# Patient Record
Sex: Female | Born: 1945 | Race: White | Hispanic: No | Marital: Married | Smoking: Former smoker
Health system: Southern US, Community
[De-identification: ages and names within clinical notes are randomized; demographics above are authoritative.]

## PROBLEM LIST (undated history)

## (undated) DIAGNOSIS — E7800 Pure hypercholesterolemia, unspecified: Secondary | ICD-10-CM

## (undated) DIAGNOSIS — N8110 Cystocele, unspecified: Secondary | ICD-10-CM

## (undated) HISTORY — PX: BREAST BIOPSY: SHX20

## (undated) HISTORY — PX: ANKLE FRACTURE SURGERY: SHX122

---

## 2011-07-28 ENCOUNTER — Inpatient Hospital Stay: Payer: Self-pay | Admitting: Unknown Physician Specialty

## 2011-07-28 LAB — CBC
HCT: 36.1 % (ref 35.0–47.0)
MCH: 30.5 pg (ref 26.0–34.0)
Platelet: 175 10*3/uL (ref 150–440)
WBC: 8.1 10*3/uL (ref 3.6–11.0)

## 2011-07-28 LAB — BASIC METABOLIC PANEL
Anion Gap: 8 (ref 7–16)
Chloride: 109 mmol/L — ABNORMAL HIGH (ref 98–107)
Co2: 26 mmol/L (ref 21–32)
EGFR (Non-African Amer.): >60
Glucose: 114 mg/dL — ABNORMAL HIGH (ref 65–99)
Potassium: 3.8 mmol/L (ref 3.5–5.1)
Sodium: 143 mmol/L (ref 136–145)

## 2014-08-04 NOTE — Discharge Summary (Signed)
PATIENT NAME:  [name], [name]#:  [id number] DATE OF BIRTH:  02-20-1946  DATE OF ADMISSION:  07/28/2011 DATE OF DISCHARGE:  07/30/2011  ADMITTING DIAGNOSIS: Left ankle trimalleolar fracture.   DISCHARGE DIAGNOSIS: Left ankle trimalleolar fracture status post ORIF of medial and lateral malleoli.   ATTENDING: Ruthann CancerJames Califf, MD, [facility]    PROCEDURE: On April 18th, the patient underwent ORIF of left ankle medial and lateral malleoli by Dr. Gerrit Krause.   ANESTHESIA: Spinal.   ESTIMATED BLOOD LOSS: 50 mL.  FLUIDS REPLACED: Crystalloid 1700 mL.  IMPLANTS: DFX long plate with 3.5 and 4.0 screws.   DRAINS: No drains were placed.   COMPLICATIONS: No complications occurred.   PATIENT HISTORY: Ms. [name] is a 69 year old who had slipped while getting out of the shower and [facility]. Ankle fracture was reduced by Dr. Gerrit Krause. X-ray showed trimalleolar fracture/dislocation.  ALLERGIES AND ADVERSE REACTIONS: None known.   PHYSICAL EXAMINATION: CARDIAC: Regular rate. RESPIRATORY: Clear breath sounds. EXTREMITIES: No pain except for left ankle which is in splint. NEUROVASCULAR: Intact.  RADIOLOGY: Chest x-ray had shown no acute abnormality. X-ray of the ankle did reveal fracture/dislocation.   HOSPITAL COURSE: The patient was admitted on April 18th because of the aforementioned fracture. Her ECG was within normal limits. Her hemogram and basic metabolic panel was largely within normal limits. She would be n.p.o. after midnight and undergo ORIF surgery by Dr. Gerrit Krause without complication on the 18th. She was taken to the PAC-U and then the orthopedic floor in stable condition. On the 19th she really was not having much pain. She had had some bloody serous drainage from her bandage splint area which was essentially normal. She would work with physical therapy and was able to ambulate 120 feet. She was nonweightbearing on her fractured leg of course. Her Foley catheter was  removed and she did pass a small amount of stool on the 19th and she would be discharged to home in stable condition the same day.   DISCHARGE MEDICATIONS:  1. Oxycodone 5 to 10 mg every four hours as needed for pain.  2. Celebrex 200 mg twice a day as needed for pain and swelling.  3. Aspirin 325 mg once a day.   DISCHARGE INSTRUCTIONS AND FOLLOW-UP: 1. She is nonweightbearing on her fractured ankle. She is to elevate the ankle.  2. Regular diet with no concentrated sweets or sugar.  3. She is to leave her dressing on. 4. Call our office for any disturbing symptoms.  5. She will follow-up for dressing change in a [facility].   ____________________________ Ashley MoynahanJonathan R. Clyde Krause, [place] jrp:drc D: 07/30/2011 17:36:57 ET T: 08/01/2011 12:39:26 ET JOB#: [id number]  cc: Ashley MoynahanJonathan R. Clyde Krause, [place], <Dictator> Ashley MoynahanJONATHAN R Irais Mottram PA ELECTRONICALLY SIGNED 08/03/2011 8:10

## 2014-08-04 NOTE — H&P (Signed)
Subjective/Chief Complaint Left ankle fracture/dislocatioon    History of Present Illness Slipped getting olut of shower. Brought to ER. Reduced by me. Xray: Trimalleollar fracture   ALLERGIES:  No Known Allergies:   Family and Social History:   Family History Non-Contributory    Social History negative tobacco, negative ETOH, negative Illicit drugs    Place of Living Home   Review of Systems:   Fever/Chills No    Cough No    Sputum No    Abdominal Pain No    Diarrhea No    Constipation No    Nausea/Vomiting No    SOB/DOE No    Chest Pain No    Dysuria No    Tolerating Diet Yes    Medications/Allergies Reviewed Medications/Allergies reviewed   Physical Exam:   GEN well developed    HEENT PERRL    NECK supple    RESP clear BS    CARD regular rate    ABD denies tenderness    EXTR No pain except left ankle. In splint. NV intact    SKIN normal to palpation    NEURO motor/sensory function intact    PSYCH A+O to time, place, person   Routine Hem:  17-Apr-13 18:47    WBC (CBC) 8.1   RBC (CBC) 4.00   Hemoglobin (CBC) 12.2   Hematocrit (CBC) 36.1   Platelet Count (CBC) 175   MCV 90   MCH 30.5   MCHC 33.8   RDW 13.3  Routine Chem:  17-Apr-13 18:47    Glucose, Serum 114   BUN 15   Creatinine (comp) 0.75   Sodium, Serum 143   Potassium, Serum 3.8   Chloride, Serum 109   CO2, Serum 26   Calcium (Total), Serum 8.5   Anion Gap 8   Osmolality (calc) 287   eGFR (African American) >60   eGFR (Non-African American) >60   Radiology Results: XRay:    17-Apr-13 18:26, Ankle Left AP and Lateral   Ankle Left AP and Lateral   REASON FOR EXAM:    eval fx  COMMENTS:       PROCEDURE: DXR - DXR ANKLE LEFT AP AND LATERAL  - Jul 28 2011  6:26PM     RESULT:     A distal fibular diaphysis fracture is present. There is a fracture of   the posterior aspect of the distal tibia. A medial malleolar fracture is   noted. Lateral malleolar fracture is  present. Slight displacement of   fracture is noted. The patient is in a cast.    IMPRESSION:  Complex fractures about the ankle involving the distal   fibular shaft, lateral malleolus,medial malleolus and posterior   malleolus. The patient is status post cast reduction.  Thank you for this opportunity to contribute to the care of your patient.           Verified By: Osa Craver, M.D., MD    17-Apr-13 18:26, Chest 1 View AP or PA   Chest 1 View AP or PA   REASON FOR EXAM:    PRE OPERATIVE  COMMENTS:       PROCEDURE: DXR - DXR CHEST 1 VIEWAP OR PA  - Jul 28 2011  6:26PM     RESULT:     The lungs are clear. The cardiovascular structures are unremarkable.    IMPRESSION:  No acute abnormality.      Thank you for this opportunity to contribute to the care of your patient.  Verified By: Osa Craver, M.D., MD     Assessment/Admission Diagnosis 1. Trimalleollar fx-dislocation left ankle    Plan ORIF medial and lateral. Risks and benefits discussed. All questions answered   Electronic Signatures: Lynnda Shields (MD)  (Signed 18-Apr-13 12:39)  Authored: CHIEF COMPLAINT and HISTORY, ALLERGIES, FAMILY AND SOCIAL HISTORY, REVIEW OF SYSTEMS, PHYSICAL EXAM, LABS, Radiology, ASSESSMENT AND PLAN   Last Updated: 18-Apr-13 12:39 by Lynnda Shields (MD)

## 2014-08-04 NOTE — Op Note (Signed)
PATIENT NAME:  [name], [name] MR#:  [id number] DATE OF BIRTH:  02/23/1946  DATE OF PROCEDURE:  07/29/2011  PREOPERATIVE DIAGNOSIS: Trimalleolar fracture-dislocation, left ankle.   POSTOPERATIVE DIAGNOSIS: Trimalleolar fracture-dislocation, left ankle.   PROCEDURE: Open reduction internal fixation of left ankle, medial and lateral aspects.   SURGEON: Winn JockJames C. Anyla Israelson, MD   ASSISTANT: None.   ANESTHESIA: Spinal.   ESTIMATED BLOOD LOSS: Minimal.   COMPLICATIONS: None.   TOURNIQUET: Not inflated.   IMPLANTS USED: DFX long fibular plate, 3.5 and 4.0 mm screws, two 4.0 mm cannulated screws medially.   COMPLICATIONS: None.   DRAINS: None.   BRIEF CLINICAL NOTE AND PATHOLOGY: The patient suffered the above-mentioned fracture on 04/17. I saw her in the Emergency Room and reduced her fracture and obtained x-rays. There was a small posterior fragment. The fracture had reduced fairly well but was very unstable. There was significant comminution of the fibula. Options, risks, and benefits were discussed with the patient. She was brought to the operating room and the hardware had fairly good fixation. Reduction was essentially anatomic. The posterior fragment reduced nicely and did not need fixation. The mortise stress showed no widening.   PROCEDURE: Preop antibiotics, adequate spinal anesthesia, supine position, routine prepping and draping. Appropriate time-out was called. Routine lateral incision was made posterior to the fibula. Subcutaneous tissues were carefully dissected, fracture was exposed. Two lag screws were placed to reduce the comminution. The plate was then applied in a routine fashion with a combination of cortical screws and locking screws. This was done with fluoroscopic guidance. Fluoroscopic views showed good positioning.   An anteromedial incision was then made exposing the medial fracture. This was reduced after irrigating the hematoma. Two cannulated screws were placed and  checked on multiplanar views. The mortise looked good. Stressing showed no widening. Incisions were thoroughly irrigated, closed with Vicryl and Monocryl. Soft sterile dressing was applied with the ankle in a right angle. Vascular examination was intact after the procedure. The spinal was still in effect. Sponge and needle counts were reported as correct prior to and after wound closure. The patient was awakened and taken to the PAC-U having tolerated the procedure well.    ____________________________ Winn JockJames C. Gerrit Heckaliff, MD jcc:drc D: 07/30/2011 07:46:52 ET T: 07/30/2011 08:23:22 ET JOB#: [id number]  cc: Winn JockJames C. Gerrit Heckaliff, MD, <Dictator> Winn JockJAMES C Jhan Conery MD ELECTRONICALLY SIGNED 08/01/2011 18:46

## 2016-07-01 ENCOUNTER — Other Ambulatory Visit: Payer: Self-pay | Admitting: Internal Medicine

## 2016-07-01 DIAGNOSIS — Z0000 Encounter for general adult medical examination without abnormal findings: Secondary | ICD-10-CM | POA: Diagnosis not present

## 2016-07-01 DIAGNOSIS — Z23 Encounter for immunization: Secondary | ICD-10-CM | POA: Diagnosis not present

## 2016-07-01 DIAGNOSIS — Z1231 Encounter for screening mammogram for malignant neoplasm of breast: Secondary | ICD-10-CM

## 2016-07-01 DIAGNOSIS — N8110 Cystocele, unspecified: Secondary | ICD-10-CM | POA: Diagnosis not present

## 2016-07-05 DIAGNOSIS — Z23 Encounter for immunization: Secondary | ICD-10-CM | POA: Diagnosis not present

## 2016-07-05 DIAGNOSIS — Z0000 Encounter for general adult medical examination without abnormal findings: Secondary | ICD-10-CM | POA: Diagnosis not present

## 2016-08-03 ENCOUNTER — Encounter: Payer: Self-pay | Admitting: Radiology

## 2016-08-03 ENCOUNTER — Ambulatory Visit
Admission: RE | Admit: 2016-08-03 | Discharge: 2016-08-03 | Disposition: A | Discharge status: Home / Self Care | Payer: Medicare HMO | Source: Ambulatory Visit | Attending: Internal Medicine | Admitting: Internal Medicine

## 2016-08-03 DIAGNOSIS — Z1231 Encounter for screening mammogram for malignant neoplasm of breast: Secondary | ICD-10-CM | POA: Diagnosis not present

## 2016-08-09 ENCOUNTER — Encounter: Payer: Self-pay | Admitting: Urology

## 2016-08-09 ENCOUNTER — Ambulatory Visit (INDEPENDENT_AMBULATORY_CARE_PROVIDER_SITE_OTHER): Payer: Medicare HMO | Admitting: Urology

## 2016-08-09 VITALS — BP 123/71 | HR 60 | Ht 62.0 in | Wt 170.9 lb

## 2016-08-09 DIAGNOSIS — N8110 Cystocele, unspecified: Secondary | ICD-10-CM

## 2016-08-09 LAB — URINALYSIS, COMPLETE
BILIRUBIN UA: NEGATIVE
GLUCOSE, UA: NEGATIVE
KETONES UA: NEGATIVE
NITRITE UA: NEGATIVE
PROTEIN UA: NEGATIVE
SPEC GRAV UA: 1.025 (ref 1.005–1.030)
Urobilinogen, Ur: 0.2 mg/dL (ref 0.2–1.0)
pH, UA: 5 (ref 5.0–7.5)

## 2016-08-09 LAB — MICROSCOPIC EXAMINATION: RBC, UA: NONE SEEN /hpf (ref 0–?)

## 2016-08-09 LAB — BLADDER SCAN AMB NON-IMAGING: SCAN RESULT: 21

## 2016-08-09 NOTE — Progress Notes (Signed)
08/09/2016 10:42 AM   [name] Jan 07, 1946 [id number]  Referring provider: Lauro Regulus, MD [address] [facility] [place], [place] [address]  Chief Complaint  Patient presents with  . New Patient (Initial Visit)    cystocele    HPI: Consult for weak stream. Pt with urinary frequency, hesitancy, weak stream and intermittent flow. She was having some SP discomfort. That started 1-2 mo ago, but fortunately her symptoms have improved. She's not bothered by any symptoms today. No NG risk. 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 She does feel a bulge and can reduce it. She's not bothered and can "put up with it". No pelvic surgery. 2 kids - both NSVD.   UA clear. PVR 21 ml.     PMH: History reviewed. No pertinent past medical history.  Surgical History: Past Surgical History:  Procedure Laterality Date  . ANKLE FRACTURE SURGERY Left   . BREAST BIOPSY Right 10+ yrs ago   NEG    Home Medications:  Allergies as of 08/09/2016   No Known Allergies     Medication List       Accurate as of 08/09/16 10:42 AM. Always use your most recent med list.          BC HEADACHE POWDER PO Take by mouth.   multivitamin tablet Take 1 tablet by mouth daily.       Allergies: No Known Allergies  Family History: Family History  Problem Relation Age of Onset  . Breast cancer Neg Hx   . Bladder Cancer Neg Hx   . Kidney cancer Neg Hx     Social History:  reports that she has quit smoking. She has never used smokeless tobacco. She reports that she does not drink alcohol or use drugs.  ROS: UROLOGY Frequent Urination?: No Hard to postpone urination?: No Burning/pain with urination?: No Get up at night to urinate?: No Leakage of urine?: No Urine stream starts and stops?: Yes Trouble starting stream?: Yes Do you have to strain to urinate?: No Blood in urine?: No Urinary tract infection?: No Sexually transmitted disease?:  No Injury to kidneys or bladder?: No Painful intercourse?: No Weak stream?: Yes Currently pregnant?: No Vaginal bleeding?: No Last menstrual period?: n  Gastrointestinal Nausea?: No Vomiting?: No Indigestion/heartburn?: No Diarrhea?: No Constipation?: No  Constitutional Fever: No Night sweats?: No Weight loss?: No Fatigue?: No  Skin Skin rash/lesions?: No Itching?: No  Eyes Blurred vision?: No Double vision?: No  Ears/Nose/Throat Sore throat?: No Sinus problems?: No  Hematologic/Lymphatic Swollen glands?: No Easy bruising?: No  Cardiovascular Leg swelling?: No Chest pain?: No  Respiratory Cough?: No Shortness of breath?: No  Endocrine Excessive thirst?: No  Musculoskeletal Back pain?: No Joint pain?: No  Neurological Headaches?: Yes Dizziness?: No  Psychologic Depression?: No Anxiety?: No  Physical Exam: Chaperone - Carrie BP 123/71 (BP Location: Left Arm, Patient Position: Sitting, Cuff Size: Normal)   Pulse 60   Ht  (1.575 m)   Wt 77.5 kg (170 lb 14.4 oz)   BMI 31.26 kg/m   Constitutional:  Alert and oriented, No acute distress. HEENT: [place] AT, moist mucus membranes.  Trachea midline, no masses. Cardiovascular: No clubbing, cyanosis, or edema. Respiratory: Normal respiratory effort, no increased work of breathing. GI: Abdomen is soft, nontender, nondistended, no abdominal masses GU: No CVA tenderness. Mild atrophy, slight enlarged introitus, no masses. Meatus nl. Bladder and urethra palpably normal. Grade II-III cystocele with apical descent  easily reducible. Skin: No rashes, bruises or suspicious lesions. Lymph: No cervical or inguinal adenopathy. Neurologic: Grossly intact, no focal deficits, moving all 4 extremities. Psychiatric: Normal mood and affect.  Laboratory Data: Lab Results  Component Value Date   WBC 8.1 07/28/2011   HGB 12.2 07/28/2011   HCT 36.1 07/28/2011   MCV 90 07/28/2011   PLT 175 07/28/2011    Lab  Results  Component Value Date   CREATININE 0.75 07/28/2011    No results found for: PSA  No results found for: TESTOSTERONE  No results found for: HGBA1C  Urinalysis No results found for: COLORURINE, APPEARANCEUR, LABSPEC, PHURINE, GLUCOSEU, HGBUR, BILIRUBINUR, KETONESUR, PROTEINUR, UROBILINOGEN, NITRITE, LEUKOCYTESUR   Assessment & Plan:   1. Cystocele, unspecified (CODE) - discussed with patient nature of POP and cystocele. Her lower urinary tract symptoms resolved. Discussed nature r/b of surveillance, pessary and surgical repair +/- hysterectomy. All questions answered. Again, she's not bothered and wants to continue surveillance.  We'll see her anytime PRN.   - Urinalysis, Complete - Bladder Scan (Post Void Residual) in office   No Follow-up on file.  Jerilee Field, MD  [facility] [address], [address] [place], [place] [address] [phone]

## 2016-08-10 ENCOUNTER — Other Ambulatory Visit: Payer: Self-pay | Admitting: Internal Medicine

## 2016-08-10 DIAGNOSIS — N6489 Other specified disorders of breast: Secondary | ICD-10-CM

## 2016-08-10 DIAGNOSIS — R921 Mammographic calcification found on diagnostic imaging of breast: Secondary | ICD-10-CM

## 2016-08-10 DIAGNOSIS — R928 Other abnormal and inconclusive findings on diagnostic imaging of breast: Secondary | ICD-10-CM

## 2016-08-16 DIAGNOSIS — Z0000 Encounter for general adult medical examination without abnormal findings: Secondary | ICD-10-CM | POA: Diagnosis not present

## 2016-08-16 DIAGNOSIS — Z23 Encounter for immunization: Secondary | ICD-10-CM | POA: Diagnosis not present

## 2016-08-17 ENCOUNTER — Ambulatory Visit
Admission: RE | Admit: 2016-08-17 | Discharge: 2016-08-17 | Disposition: A | Discharge status: Home / Self Care | Payer: Medicare HMO | Source: Ambulatory Visit | Attending: Internal Medicine | Admitting: Internal Medicine

## 2016-08-17 DIAGNOSIS — R921 Mammographic calcification found on diagnostic imaging of breast: Secondary | ICD-10-CM

## 2016-08-17 DIAGNOSIS — R922 Inconclusive mammogram: Secondary | ICD-10-CM | POA: Diagnosis not present

## 2016-08-17 DIAGNOSIS — R928 Other abnormal and inconclusive findings on diagnostic imaging of breast: Secondary | ICD-10-CM

## 2016-08-17 DIAGNOSIS — N6489 Other specified disorders of breast: Secondary | ICD-10-CM

## 2016-08-19 ENCOUNTER — Other Ambulatory Visit: Payer: Self-pay | Admitting: Internal Medicine

## 2016-08-19 DIAGNOSIS — N6489 Other specified disorders of breast: Secondary | ICD-10-CM

## 2016-08-19 DIAGNOSIS — I700 Atherosclerosis of aorta: Secondary | ICD-10-CM

## 2017-06-24 DIAGNOSIS — Z0000 Encounter for general adult medical examination without abnormal findings: Secondary | ICD-10-CM | POA: Diagnosis not present

## 2017-06-24 DIAGNOSIS — Z1231 Encounter for screening mammogram for malignant neoplasm of breast: Secondary | ICD-10-CM | POA: Diagnosis not present

## 2017-06-24 DIAGNOSIS — G44219 Episodic tension-type headache, not intractable: Secondary | ICD-10-CM | POA: Diagnosis not present

## 2017-06-24 DIAGNOSIS — M1712 Unilateral primary osteoarthritis, left knee: Secondary | ICD-10-CM | POA: Diagnosis not present

## 2017-06-24 DIAGNOSIS — M170 Bilateral primary osteoarthritis of knee: Secondary | ICD-10-CM | POA: Diagnosis not present

## 2017-07-18 DIAGNOSIS — Z0000 Encounter for general adult medical examination without abnormal findings: Secondary | ICD-10-CM | POA: Diagnosis not present

## 2019-04-04 ENCOUNTER — Ambulatory Visit: Payer: Medicare Other | Attending: Internal Medicine

## 2019-04-04 DIAGNOSIS — Z20822 Contact with and (suspected) exposure to covid-19: Secondary | ICD-10-CM

## 2019-04-06 LAB — NOVEL CORONAVIRUS, NAA: SARS-CoV-2, NAA: NOT DETECTED

## 2019-04-07 ENCOUNTER — Telehealth: Payer: Self-pay | Admitting: Internal Medicine

## 2019-04-07 NOTE — Telephone Encounter (Signed)
Negative COVID results given. Patient results "NOT Detected." Caller expressed understanding. ° °

## 2021-05-12 ENCOUNTER — Other Ambulatory Visit
Admission: RE | Admit: 2021-05-12 | Discharge: 2021-05-12 | Disposition: A | Discharge status: Home / Self Care | Payer: Medicare Other | Source: Ambulatory Visit | Attending: Internal Medicine | Admitting: Internal Medicine

## 2021-05-12 DIAGNOSIS — R0789 Other chest pain: Secondary | ICD-10-CM | POA: Insufficient documentation

## 2021-05-12 LAB — D-DIMER, QUANTITATIVE: D-Dimer, Quant: 0.5 ug/mL-FEU (ref 0.00–0.50)

## 2021-05-12 LAB — TROPONIN I (HIGH SENSITIVITY): Troponin I (High Sensitivity): 5 ng/L (ref <18)

## 2021-07-07 ENCOUNTER — Other Ambulatory Visit: Payer: Self-pay | Admitting: Cardiology

## 2021-07-07 DIAGNOSIS — E7800 Pure hypercholesterolemia, unspecified: Secondary | ICD-10-CM

## 2021-07-07 DIAGNOSIS — R079 Chest pain, unspecified: Secondary | ICD-10-CM

## 2021-07-10 ENCOUNTER — Telehealth (HOSPITAL_COMMUNITY): Payer: Self-pay | Admitting: *Deleted

## 2021-07-10 NOTE — Telephone Encounter (Signed)
Reaching out to patient to offer assistance regarding upcoming cardiac imaging study; pt verbalizes understanding of appt date/time, parking situation and where to check in, pre-test NPO status  and verified current allergies; name and call back number provided for further questions should they arise ? ?Ashley Graffam RN Navigator Cardiac Imaging ?[facility] ?[phone] office ?[phone] cell ? ?

## 2021-07-13 ENCOUNTER — Ambulatory Visit
Admission: RE | Admit: 2021-07-13 | Discharge: 2021-07-13 | Disposition: A | Discharge status: Home / Self Care | Payer: Medicare Other | Source: Ambulatory Visit | Attending: Cardiology | Admitting: Cardiology

## 2021-07-13 ENCOUNTER — Other Ambulatory Visit: Payer: Self-pay

## 2021-07-13 DIAGNOSIS — R9431 Abnormal electrocardiogram [ECG] [EKG]: Secondary | ICD-10-CM | POA: Diagnosis not present

## 2021-07-13 DIAGNOSIS — R079 Chest pain, unspecified: Secondary | ICD-10-CM | POA: Insufficient documentation

## 2021-07-13 DIAGNOSIS — R0789 Other chest pain: Secondary | ICD-10-CM | POA: Insufficient documentation

## 2021-07-13 DIAGNOSIS — E7800 Pure hypercholesterolemia, unspecified: Secondary | ICD-10-CM | POA: Insufficient documentation

## 2021-07-13 DIAGNOSIS — R9439 Abnormal result of other cardiovascular function study: Secondary | ICD-10-CM | POA: Insufficient documentation

## 2021-07-13 DIAGNOSIS — Z79899 Other long term (current) drug therapy: Secondary | ICD-10-CM | POA: Insufficient documentation

## 2021-07-13 DIAGNOSIS — Z9181 History of falling: Secondary | ICD-10-CM | POA: Diagnosis not present

## 2021-07-13 LAB — POCT I-STAT CREATININE: Creatinine, Ser: 1.1 mg/dL — ABNORMAL HIGH (ref 0.44–1.00)

## 2021-07-13 MED ORDER — NITROGLYCERIN 0.4 MG SL SUBL
0.8000 mg | SUBLINGUAL_TABLET | Freq: Once | SUBLINGUAL | Status: AC
  Administered 2021-07-13: 0.8 mg via SUBLINGUAL

## 2021-07-13 MED ORDER — IOHEXOL 350 MG/ML SOLN
80.0000 mL | Freq: Once | INTRAVENOUS | Status: AC | PRN
  Administered 2021-07-13: 75 mL via INTRAVENOUS

## 2021-07-13 NOTE — Progress Notes (Signed)
Patient tolerated procedure well. Ambulate w/o difficulty. Denies any lightheadedness or being dizzy. Pt denies any pain at this time. Sitting in chair, drinking water provided. P is encouraged to drink additional water throughout the day and reason explained to patient. Patient verbalized understanding and all questions answered. ABC intact. No further needs at this time. Discharge from procedure area w/o issues.  °

## 2022-07-23 ENCOUNTER — Emergency Department: Payer: 59

## 2022-07-23 ENCOUNTER — Emergency Department
Admission: EM | Admit: 2022-07-23 | Discharge: 2022-07-23 | Disposition: A | Discharge status: Home / Self Care | Payer: 59 | Attending: Emergency Medicine | Admitting: Emergency Medicine

## 2022-07-23 ENCOUNTER — Other Ambulatory Visit: Payer: Self-pay

## 2022-07-23 ENCOUNTER — Encounter: Payer: Self-pay | Admitting: Intensive Care

## 2022-07-23 DIAGNOSIS — M79605 Pain in left leg: Secondary | ICD-10-CM

## 2022-07-23 DIAGNOSIS — M25562 Pain in left knee: Secondary | ICD-10-CM | POA: Insufficient documentation

## 2022-07-23 DIAGNOSIS — M79652 Pain in left thigh: Secondary | ICD-10-CM | POA: Insufficient documentation

## 2022-07-23 HISTORY — DX: Pure hypercholesterolemia, unspecified: E78.00

## 2022-07-23 LAB — COMPREHENSIVE METABOLIC PANEL
ALT: 23 U/L (ref 0–44)
AST: 34 U/L (ref 15–41)
Albumin: 4.3 g/dL (ref 3.5–5.0)
Alkaline Phosphatase: 55 U/L (ref 38–126)
Anion gap: 10 (ref 5–15)
BUN: 16 mg/dL (ref 8–23)
CO2: 24 mmol/L (ref 22–32)
Calcium: 9.1 mg/dL (ref 8.9–10.3)
Chloride: 106 mmol/L (ref 98–111)
Creatinine, Ser: 0.9 mg/dL (ref 0.44–1.00)
GFR, Estimated: >60 mL/min (ref >60)
Glucose, Bld: 109 mg/dL — ABNORMAL HIGH (ref 70–99)
Potassium: 4 mmol/L (ref 3.5–5.1)
Sodium: 140 mmol/L (ref 135–145)
Total Bilirubin: 0.6 mg/dL (ref 0.3–1.2)
Total Protein: 7.3 g/dL (ref 6.5–8.1)

## 2022-07-23 LAB — CBC WITH DIFFERENTIAL/PLATELET
Abs Immature Granulocytes: 0.02 10*3/uL (ref 0.00–0.07)
Basophils Absolute: 0.1 10*3/uL (ref 0.0–0.1)
Basophils Relative: 1 %
Eosinophils Absolute: 0.1 10*3/uL (ref 0.0–0.5)
Eosinophils Relative: 1 %
HCT: 35 % — ABNORMAL LOW (ref 36.0–46.0)
Hemoglobin: 11.4 g/dL — ABNORMAL LOW (ref 12.0–15.0)
Immature Granulocytes: 0 %
Lymphocytes Relative: 20 %
Lymphs Abs: 1.6 10*3/uL (ref 0.7–4.0)
MCH: 30.3 pg (ref 26.0–34.0)
MCHC: 32.6 g/dL (ref 30.0–36.0)
MCV: 93.1 fL (ref 80.0–100.0)
Monocytes Absolute: 0.7 10*3/uL (ref 0.1–1.0)
Monocytes Relative: 8 %
Neutro Abs: 5.8 10*3/uL (ref 1.7–7.7)
Neutrophils Relative %: 70 %
Platelets: 145 10*3/uL — ABNORMAL LOW (ref 150–400)
RBC: 3.76 MIL/uL — ABNORMAL LOW (ref 3.87–5.11)
RDW: 12.5 % (ref 11.5–15.5)
WBC: 8.3 10*3/uL (ref 4.0–10.5)
nRBC: 0 % (ref 0.0–0.2)

## 2022-07-23 MED ORDER — TRAMADOL HCL 50 MG PO TABS: 50.0000 mg | ORAL_TABLET | Freq: Two times a day (BID) | ORAL | 0 refills | Status: AC | PRN

## 2022-07-23 MED ORDER — PREDNISONE 20 MG PO TABS: 40.0000 mg | ORAL_TABLET | Freq: Every day | ORAL | 0 refills | Status: AC

## 2022-07-23 NOTE — ED Triage Notes (Signed)
Patient c/o left leg pain. Warm to touch and painful. Reports it started yesterday morning and has progressively gotten worse.

## 2022-07-23 NOTE — Discharge Instructions (Signed)
Your leg pain is likely due to sciatica (nerve pain) or a muscle strain or spasm.  Take over-the-counter Tylenol or ibuprofen.  You may take the tramadol if needed for more severe pain that is not relieved by the over-the-counter medications, and you should take the prednisone daily for the next 5 days.  This will help decrease inflammation and should help with the pain.  Return to the ER for new, worsening, or persistent severe pain, swelling or redness to the leg, inability to put weight on the leg, weakness or numbness, or any other new or worsening symptoms that concern you.

## 2022-07-23 NOTE — ED Provider Notes (Signed)
[facility] Provider Note    Event Date/Time   First MD Initiated Contact with Patient 07/23/22 1029     (approximate)   History   Leg Pain   HPI  [name] is a 77 y.o. female with a history of prediabetes, hypercholesterolemia, and osteoarthritis who presents with atraumatic left leg pain for the last several days.  The patient states that it is worse with certain movements and positions and she had difficulty getting out of bed today.  She denies any fall or injury to the leg.  She denies any weakness or numbness and has no tingling.  She states the pain is mainly in the back of the leg especially behind the knee and behind her thigh.  She states it radiates from the hip area.  She has no lower back pain.  She denies any urinary symptoms.  I reviewed the medical records.  The patient was most recently seen by internal medicine on 1/22 for routine follow-up.  She has no recent ED visits or admissions.   Physical Exam   Triage Vital Signs: ED Triage Vitals  Enc Vitals Group     BP 07/23/22 0831 131/70     Pulse Rate 07/23/22 0831 (!) 59     Resp 07/23/22 0831 16     Temp 07/23/22 0836 98.2 F (36.8 C)     Temp Source 07/23/22 0836 Oral     SpO2 07/23/22 0831 100 %     Weight 07/23/22 0832 145 lb (65.8 kg)     Height 07/23/22 0832  (1.575 m)     Head Circumference --      Peak Flow --      Pain Score 07/23/22 0831 10     Pain Loc --      Pain Edu? --      Excl. in GC? --     Most recent vital signs: Vitals:   07/23/22 1100 07/23/22 1200  BP:    Pulse: (!) 53 74  Resp:    Temp:    SpO2: 98% 100%     General: Alert, well-appearing, no distress.  CV:  Good peripheral perfusion.  Resp:  Normal effort.  Abd:  No distention.  Other:  Left thigh with mild tenderness laterally.  No swelling, erythema, induration, or abnormal warmth.  5/5 motor strength and intact sensation proximally and distally.  Pain on ROM of the knee and ROM  limited to approximately 100 degrees in flexion.  2+ DP pulse.  Normal cap refill.  No midline spinal tenderness.   ED Results / Procedures / Treatments   Labs (all labs ordered are listed, but only abnormal results are displayed) Labs Reviewed  CBC WITH DIFFERENTIAL/PLATELET - Abnormal; Notable for the following components:      Result Value   RBC 3.76 (*)    Hemoglobin 11.4 (*)    HCT 35.0 (*)    Platelets 145 (*)    All other components within normal limits  COMPREHENSIVE METABOLIC PANEL - Abnormal; Notable for the following components:   Glucose, Bld 109 (*)    All other components within normal limits     EKG     RADIOLOGY  XR L femur: I independently viewed and interpreted the images; no acute fracture or other abnormality  US venous LLE: No evidence of acute DVT   PROCEDURES:  Critical Care performed: No  Procedures   MEDICATIONS ORDERED IN ED: Medications - No data to display  IMPRESSION / MDM / ASSESSMENT AND PLAN / ED COURSE  I reviewed the triage vital signs and the nursing notes.  77 year old female presents with atraumatic left leg pain rating down from her lower back/hip area along the back and side of the thigh.  There are no significant findings on exam other than some mild tenderness as well as decreased range of motion at the knee.  Basic labs were obtained from triage and show normal electrolytes and no leukocytosis.  Ultrasound is negative for DVT.  Differential diagnosis includes, but is not limited to, sciatica, other neuropathic pain, muscle strain or spasm.  There is no evidence of cellulitis or other acute infectious process.  We will obtain an x-ray for further evaluation.  Patient's presentation is most consistent with acute complicated illness / injury requiring diagnostic workup.  The patient is on the cardiac monitor to evaluate for evidence of arrhythmia and/or significant heart rate  changes.  ----------------------------------------- 12:10 PM on 07/23/2022 -----------------------------------------  X-ray shows no acute findings.  At this time the patient is stable for discharge home.  I counseled her on the results of the workup and plan of care.  Overall I suspect either sciatica or muscle strain/spasm.  I prescribed prednisone as well as tramadol for breakthrough pain.  I gave strict return precautions and the patient expressed understanding.  FINAL CLINICAL IMPRESSION(S) / ED DIAGNOSES   Final diagnoses:  Left leg pain     Rx / DC Orders   ED Discharge Orders          Ordered    traMADol (ULTRAM) 50 MG tablet  Every 12 hours PRN        07/23/22 1209    predniSONE (DELTASONE) 20 MG tablet  Daily        07/23/22 1209             Note:  This document was prepared using Dragon voice recognition software and may include unintentional dictation errors.    Dionne Bucy, MD 07/23/22 1210

## 2022-07-23 NOTE — ED Notes (Signed)
ED Provider at bedside. 

## 2023-06-24 IMAGING — CT CT HEART MORP W/ CTA COR W/ SCORE W/ CA W/CM &/OR W/O CM
1 of 14 series · 3 of 20 positions shown, 4 images · non-contrast
Comparison: None.

Addendum:
CLINICAL DATA: Chest pain

EXAM:
Cardiac/Coronary  CTA
TECHNIQUE: The patient was scanned on a Siemens [employer] go.Top scanner.

[Series 4: multiphase % cta coronary 0.60 · axial · 0.31mm/px · z∈[-1085,-1015]mm · 3 of 3872 slices shown, 4 images]
[im [id number]  vessel]
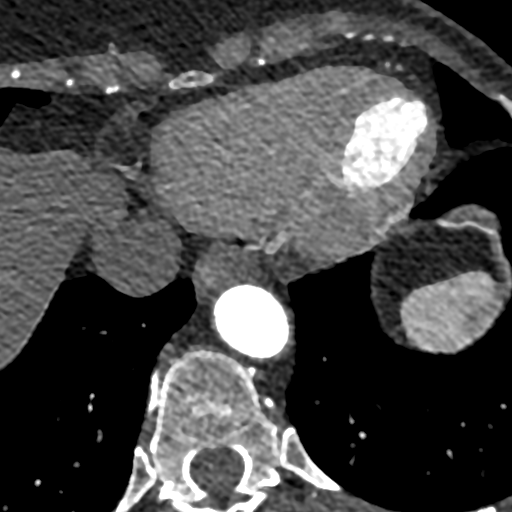
[im [id number]  lung]
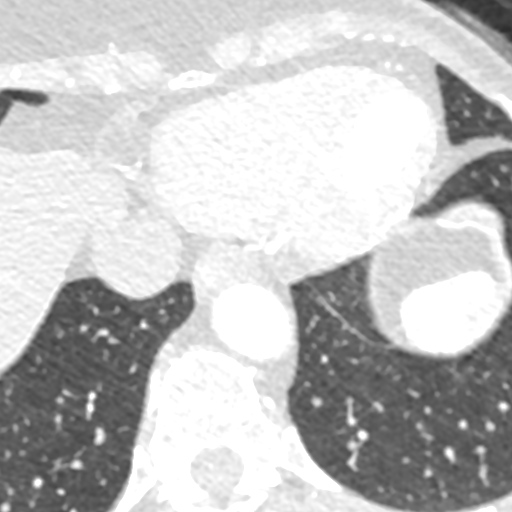
[im [id number]  vessel]
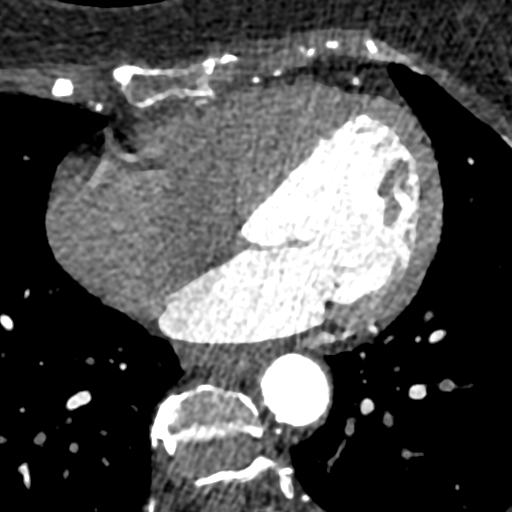
[im [id number]  vessel]
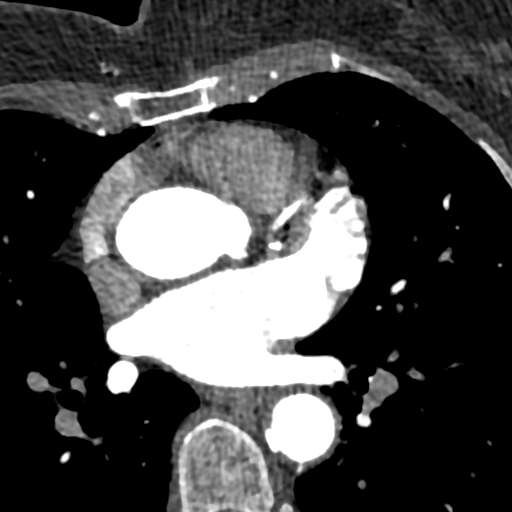

[3 of 20 positions shown; findings below may reference images not displayed]

:
A retrospective scan was triggered in the descending thoracic aorta.
Axial non-contrast 3 mm slices were carried out through the heart.
The data set was analyzed on a dedicated work station and scored
using the Agatson method. Gantry rotation speed was 330 msecs and
collimation was .6 mm. 100mg of metoprolol and 0.8 mg of sl NTG was
given. The 3D data set was reconstructed in 5% intervals of the
60-95 % of the R-R cycle. Diastolic phases were analyzed on a
dedicated work station using MPR, MIP and VRT modes. The patient
received 80 cc of contrast.
FINDINGS: Aorta: Normal size. Descending aorta calcifications. No dissection.

Aortic Valve:  Trileaflet.  No calcifications.

Coronary Arteries:  Normal coronary origin.  Right dominance.

RCA is a dominant artery that gives rise to PDA and PLA. There is no
plaque.

Left main is a large artery that gives rise to LAD and LCX arteries.
LM has no disease.

LAD has no plaque.

LCX is a non-dominant artery that gives rise to two obtuse marginal
branches. There is no plaque.

Other findings:

Normal pulmonary vein drainage into the left atrium.

Normal left atrial appendage without a thrombus.

Normal size of the pulmonary artery.
IMPRESSION: 1. Coronary calcium score of 0.4. This was 18th percentile for age
and gender matched controls.

2. Normal coronary origin with right dominance.

3. No angiographic evidence of CAD.

4. Consider non-atherosclerotic causes of chest pain.

EXAM:
OVER-READ INTERPRETATION  CT CHEST

The following report is an over-read performed by radiologist Dr.
over-read does not include interpretation of cardiac or coronary
anatomy or pathology. The interpretation by the cardiologist is
attached.
FINDINGS: No acute extracardiac findings.
IMPRESSION: No acute extracardiac findings.

*** End of Addendum ***
:
A retrospective scan was triggered in the descending thoracic aorta.
Axial non-contrast 3 mm slices were carried out through the heart.
The data set was analyzed on a dedicated work station and scored
using the Agatson method. Gantry rotation speed was 330 msecs and
collimation was .6 mm. 100mg of metoprolol and 0.8 mg of sl NTG was
given. The 3D data set was reconstructed in 5% intervals of the
60-95 % of the R-R cycle. Diastolic phases were analyzed on a
dedicated work station using MPR, MIP and VRT modes. The patient
received 80 cc of contrast.
FINDINGS: Aorta: Normal size. Descending aorta calcifications. No dissection.

Aortic Valve:  Trileaflet.  No calcifications.

Coronary Arteries:  Normal coronary origin.  Right dominance.

RCA is a dominant artery that gives rise to PDA and PLA. There is no
plaque.

Left main is a large artery that gives rise to LAD and LCX arteries.
LM has no disease.

LAD has no plaque.

LCX is a non-dominant artery that gives rise to two obtuse marginal
branches. There is no plaque.

Other findings:

Normal pulmonary vein drainage into the left atrium.

Normal left atrial appendage without a thrombus.

Normal size of the pulmonary artery.
IMPRESSION: 1. Coronary calcium score of 0.4. This was 18th percentile for age
and gender matched controls.

2. Normal coronary origin with right dominance.

3. No angiographic evidence of CAD.

4. Consider non-atherosclerotic causes of chest pain.

## 2023-12-03 ENCOUNTER — Emergency Department
Admission: EM | Admit: 2023-12-03 | Discharge: 2023-12-03 | Disposition: A | Discharge status: Home / Self Care | Attending: Emergency Medicine | Admitting: Emergency Medicine

## 2023-12-03 ENCOUNTER — Emergency Department

## 2023-12-03 ENCOUNTER — Other Ambulatory Visit: Payer: Self-pay

## 2023-12-03 DIAGNOSIS — K529 Noninfective gastroenteritis and colitis, unspecified: Secondary | ICD-10-CM | POA: Insufficient documentation

## 2023-12-03 DIAGNOSIS — R109 Unspecified abdominal pain: Secondary | ICD-10-CM | POA: Diagnosis present

## 2023-12-03 HISTORY — DX: Cystocele, unspecified: N81.10

## 2023-12-03 LAB — URINALYSIS, ROUTINE W REFLEX MICROSCOPIC
Bilirubin Urine: NEGATIVE
Glucose, UA: NEGATIVE mg/dL
Hgb urine dipstick: NEGATIVE
Ketones, ur: NEGATIVE mg/dL
Nitrite: POSITIVE — AB
Protein, ur: NEGATIVE mg/dL
Specific Gravity, Urine: >1.046 — ABNORMAL HIGH (ref 1.005–1.030)
pH: 5 (ref 5.0–8.0)

## 2023-12-03 LAB — COMPREHENSIVE METABOLIC PANEL WITH GFR
ALT: 23 U/L (ref 0–44)
AST: 39 U/L (ref 15–41)
Albumin: 4.1 g/dL (ref 3.5–5.0)
Alkaline Phosphatase: 48 U/L (ref 38–126)
Anion gap: 10 (ref 5–15)
BUN: 20 mg/dL (ref 8–23)
CO2: 25 mmol/L (ref 22–32)
Calcium: 9.3 mg/dL (ref 8.9–10.3)
Chloride: 105 mmol/L (ref 98–111)
Creatinine, Ser: 1.03 mg/dL — ABNORMAL HIGH (ref 0.44–1.00)
GFR, Estimated: 55 mL/min — ABNORMAL LOW (ref >60)
Glucose, Bld: 136 mg/dL — ABNORMAL HIGH (ref 70–99)
Potassium: 4 mmol/L (ref 3.5–5.1)
Sodium: 140 mmol/L (ref 135–145)
Total Bilirubin: 0.6 mg/dL (ref 0.0–1.2)
Total Protein: 6.9 g/dL (ref 6.5–8.1)

## 2023-12-03 LAB — CBC
HCT: 36.2 % (ref 36.0–46.0)
Hemoglobin: 12.1 g/dL (ref 12.0–15.0)
MCH: 30.9 pg (ref 26.0–34.0)
MCHC: 33.4 g/dL (ref 30.0–36.0)
MCV: 92.3 fL (ref 80.0–100.0)
Platelets: 172 K/uL (ref 150–400)
RBC: 3.92 MIL/uL (ref 3.87–5.11)
RDW: 12.6 % (ref 11.5–15.5)
WBC: 6.5 K/uL (ref 4.0–10.5)
nRBC: 0 % (ref 0.0–0.2)

## 2023-12-03 LAB — LIPASE, BLOOD: Lipase: 42 U/L (ref 11–51)

## 2023-12-03 MED ORDER — MORPHINE SULFATE (PF) 2 MG/ML IV SOLN
2.0000 mg | Freq: Once | INTRAVENOUS | Status: AC
  Administered 2023-12-03: 2 mg via INTRAVENOUS
  Filled 2023-12-03: qty 1

## 2023-12-03 MED ORDER — IOHEXOL 300 MG/ML  SOLN
100.0000 mL | Freq: Once | INTRAMUSCULAR | Status: AC | PRN
  Administered 2023-12-03: 100 mL via INTRAVENOUS

## 2023-12-03 MED ORDER — ONDANSETRON HCL 4 MG/2ML IJ SOLN
4.0000 mg | Freq: Once | INTRAMUSCULAR | Status: AC
  Administered 2023-12-03: 4 mg via INTRAVENOUS
  Filled 2023-12-03: qty 2

## 2023-12-03 MED ORDER — SODIUM CHLORIDE 0.9 % IV BOLUS
500.0000 mL | Freq: Once | INTRAVENOUS | Status: AC
  Administered 2023-12-03: 500 mL via INTRAVENOUS

## 2023-12-03 NOTE — ED Notes (Signed)
 IVF infusing, pt resting comfortably, husband at bedside.

## 2023-12-03 NOTE — ED Notes (Signed)
 EDP at bedside

## 2023-12-03 NOTE — ED Provider Notes (Signed)
 [facility] Provider Note    Event Date/Time   First MD Initiated Contact with Patient 12/03/23 0840     (approximate)   History   Abdominal Pain   HPI  [name] is a 78 y.o. female with a history of bladder prolapse and high cholesterol who presents with complaints of abdominal pain that started abruptly this morning.  She reports mild nausea she had a semiloose stool as well.  No fevers reported.  Felt well when she went to bed last night.  No history of abdominal surgery reported.     Physical Exam   Triage Vital Signs: ED Triage Vitals  Encounter Vitals Group     BP 12/03/23 0841 (!) 175/97     Girls Systolic BP Percentile --      Girls Diastolic BP Percentile --      Boys Systolic BP Percentile --      Boys Diastolic BP Percentile --      Pulse Rate 12/03/23 0841 (!) 51     Resp 12/03/23 0841 16     Temp 12/03/23 0841 97.7 F (36.5 C)     Temp Source 12/03/23 0841 Oral     SpO2 12/03/23 0841 100 %     Weight 12/03/23 0840 61.2 kg (135 lb)     Height 12/03/23 0840 1.575 m (5' 2)     Head Circumference --      Peak Flow --      Pain Score 12/03/23 0838 10     Pain Loc --      Pain Education --      Exclude from Growth Chart --     Most recent vital signs: Vitals:   12/03/23 0841 12/03/23 1115  BP: (!) 175/97   Pulse: (!) 51 60  Resp: 16 17  Temp: 97.7 F (36.5 C)   SpO2: 100% 96%     General: Awake, no distress.  CV:  Good peripheral perfusion.  Resp:  Normal effort.  Abd:  No distention.  Mild tenderness diffusely, no CVA tenderness Other:     ED Results / Procedures / Treatments   Labs (all labs ordered are listed, but only abnormal results are displayed) Labs Reviewed  COMPREHENSIVE METABOLIC PANEL WITH GFR - Abnormal; Notable for the following components:      Result Value   Glucose, Bld 136 (*)    Creatinine, Ser 1.03 (*)    GFR, Estimated 55 (*)    All other components within normal limits   URINALYSIS, ROUTINE W REFLEX MICROSCOPIC - Abnormal; Notable for the following components:   Color, Urine YELLOW (*)    APPearance HAZY (*)    Specific Gravity, Urine >1.046 (*)    Nitrite POSITIVE (*)    Leukocytes,Ua LARGE (*)    Bacteria, UA FEW (*)    All other components within normal limits  LIPASE, BLOOD  CBC     EKG  ED ECG REPORT I, Lamar Price, the attending physician, personally viewed and interpreted this ECG.  Date: 12/03/2023  Rhythm: normal sinus rhythm QRS Axis: normal Intervals: normal ST/T Wave abnormalities: normal Narrative Interpretation: no evidence of acute ischemia    RADIOLOGY CT abdomen pelvis    PROCEDURES:  Critical Care performed:   Procedures   MEDICATIONS ORDERED IN ED: Medications  sodium chloride  0.9 % bolus 500 mL (0 mLs Intravenous Stopped 12/03/23 1047)  morphine  (PF) 2 MG/ML injection 2 mg (2 mg Intravenous Given 12/03/23 0908)  ondansetron  (  ZOFRAN ) injection 4 mg (4 mg Intravenous Given 12/03/23 0908)  iohexol  (OMNIPAQUE ) 300 MG/ML solution 100 mL (100 mLs Intravenous Contrast Given 12/03/23 0943)     IMPRESSION / MDM / ASSESSMENT AND PLAN / ED COURSE  I reviewed the triage vital signs and the nursing notes. Patient's presentation is most consistent with acute presentation with potential threat to life or bodily function.  Patient presents with abdominal pain as detailed above, differential includes foodborne illness, gastroenteritis, appendicitis, cholecystitis, pancreatitis  Labs pending, will treat with IV morphine , IV Zofran , IV fluids and reevaluate.,  Will obtain CT abdomen pelvis  Lab work is quite reassuring  CT scan is without acute abnormality, discussed incidental findings with the patient and the need for outpatient follow-up MRI with she and her son.  She is feeling much better, no further pain or nausea, suspect viral gastroenteritis versus foodborne illness, appropriate for discharge with outpatient  follow-up      FINAL CLINICAL IMPRESSION(S) / ED DIAGNOSES   Final diagnoses:  Gastroenteritis     Rx / DC Orders   ED Discharge Orders     None        Note:  This document was prepared using Dragon voice recognition software and may include unintentional dictation errors.   Arlander Charleston, MD 12/03/23 1250

## 2023-12-03 NOTE — ED Triage Notes (Signed)
 From home [facility] for abdominal pain since 1.5hr ago and N/V (2 times). Pain is sharp and generalized.   EMS VS HR 55, 100% RA, 175/75  Skin dry, respirations unlabored. Pt in no acute distress  Pt states LBM this AM was black. Has been taking pepto bismol all week.
